# Patient Record
Sex: Female | Born: 1990 | Race: White | Hispanic: No | Marital: Single | Smoking: Never smoker
Health system: Southern US, Community
[De-identification: ages and names within clinical notes are randomized; demographics above are authoritative.]

## PROBLEM LIST (undated history)

## (undated) DIAGNOSIS — R51 Headache: Secondary | ICD-10-CM

## (undated) DIAGNOSIS — T4145XA Adverse effect of unspecified anesthetic, initial encounter: Secondary | ICD-10-CM

## (undated) DIAGNOSIS — T753XXA Motion sickness, initial encounter: Secondary | ICD-10-CM

## (undated) DIAGNOSIS — T8859XA Other complications of anesthesia, initial encounter: Secondary | ICD-10-CM

## (undated) DIAGNOSIS — R519 Headache, unspecified: Secondary | ICD-10-CM

## (undated) HISTORY — PX: TOOTH EXTRACTION: SUR596

## (undated) HISTORY — PX: TONSILLECTOMY: SUR1361

## (undated) HISTORY — PX: WISDOM TOOTH EXTRACTION: SHX21

---
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
## 2012-10-13 ENCOUNTER — Other Ambulatory Visit: Payer: Self-pay | Admitting: Physical Medicine and Rehabilitation

## 2012-10-13 DIAGNOSIS — M545 Low back pain, unspecified: Secondary | ICD-10-CM

## 2012-10-20 ENCOUNTER — Other Ambulatory Visit: Payer: Self-pay | Admitting: Physical Medicine and Rehabilitation

## 2012-10-20 ENCOUNTER — Ambulatory Visit
Admission: RE | Admit: 2012-10-20 | Discharge: 2012-10-20 | Disposition: A | Discharge status: Home / Self Care | Payer: BC Managed Care – HMO | Source: Ambulatory Visit | Attending: Physical Medicine and Rehabilitation | Admitting: Physical Medicine and Rehabilitation

## 2012-10-20 DIAGNOSIS — M545 Low back pain, unspecified: Secondary | ICD-10-CM

## 2012-10-20 MED ORDER — IOHEXOL 180 MG/ML  SOLN
1.0000 mL | Freq: Once | INTRAMUSCULAR | Status: AC | PRN
  Administered 2012-10-20: 1 mL via EPIDURAL

## 2012-10-20 MED ORDER — METHYLPREDNISOLONE ACETATE 40 MG/ML INJ SUSP (RADIOLOG
120.0000 mg | Freq: Once | INTRAMUSCULAR | Status: AC
  Administered 2012-10-20: 120 mg via EPIDURAL

## 2015-02-02 NOTE — Discharge Instructions (Signed)
General Anesthesia, Adult °General anesthesia is a sleep-like state of non-feeling produced by medicines (anesthetics). General anesthesia prevents you from being alert and feeling pain during a medical procedure. Your caregiver may recommend general anesthesia if your procedure: °· Is long. °· Is painful or uncomfortable. °· Would be frightening to see or hear. °· Requires you to be still. °· Affects your breathing. °· Causes significant blood loss. °LET YOUR CAREGIVER KNOW ABOUT: °· Allergies to food or medicine. °· Medicines taken, including vitamins, herbs, eyedrops, over-the-counter medicines, and creams. °· Use of steroids (by mouth or creams). °· Previous problems with anesthetics or numbing medicines, including problems experienced by relatives. °· History of bleeding problems or blood clots. °· Previous surgeries and types of anesthetics received. °· Possibility of pregnancy, if this applies. °· Use of cigarettes, alcohol, or illegal drugs. °· Any health condition(s), especially diabetes, sleep apnea, and high blood pressure. °RISKS AND COMPLICATIONS °General anesthesia rarely causes complications. However, if complications do occur, they can be life threatening. Complications include: °· A lung infection. °· A stroke. °· A heart attack. °· Waking up during the procedure. When this occurs, the patient may be unable to move and communicate that he or she is awake. The patient may feel severe pain. °Older adults and adults with serious medical problems are more likely to have complications than adults who are young and healthy. Some complications can be prevented by answering all of your caregiver's questions thoroughly and by following all pre-procedure instructions. It is important to tell your caregiver if any of the pre-procedure instructions, especially those related to diet, were not followed. Any food or liquid in the stomach can cause problems when you are under general anesthesia. °BEFORE THE  PROCEDURE °· Ask your caregiver if you will have to spend the night at the hospital. If you will not have to spend the night, arrange to have an adult drive you and stay with you for 24 hours. °· Follow your caregiver's instructions if you are taking dietary supplements or medicines. Your caregiver may tell you to stop taking them or to reduce your dosage. °· Do not smoke for as long as possible before your procedure. If possible, stop smoking 3-6 weeks before the procedure. °· Do not take new dietary supplements or medicines within 1 week of your procedure unless your caregiver approves them. °· Do not eat within 8 hours of your procedure or as directed by your caregiver. Drink only clear liquids, such as water, black coffee (without milk or cream), and fruit juices (without pulp). °· Do not drink within 3 hours of your procedure or as directed by your caregiver. °· You may brush your teeth on the morning of the procedure, but make sure to spit out the toothpaste and water when finished. °PROCEDURE  °You will receive anesthetics through a mask, through an intravenous (IV) access tube, or through both. A doctor who specializes in anesthesia (anesthesiologist) or a nurse who specializes in anesthesia (nurse anesthetist) or both will stay with you throughout the procedure to make sure you remain unconscious. He or she will also watch your blood pressure, pulse, and oxygen levels to make sure that the anesthetics do not cause any problems. Once you are asleep, a breathing tube or mask may be used to help you breathe. °AFTER THE PROCEDURE °You will wake up after the procedure is complete. You may be in the room where the procedure was performed or in a recovery area. You may have a sore throat   if a breathing tube was used. You may also feel:  Dizzy.  Weak.  Drowsy.  Confused.  Nauseous.  Cold. These are all normal responses and can be expected to last for up to 24 hours after the procedure is complete. A  caregiver will tell you when you are ready to go home. This will usually be when you are fully awake and in stable condition.   This information is not intended to replace advice given to you by your health care provider. Make sure you discuss any questions you have with your health care provider.   Document Released: 06/12/2007 Document Revised: 03/26/2014 Document Reviewed: 07/04/2011 Elsevier Interactive Patient Education 2016 [employer]    [facility] ENDOSCOPIC [facility]US [facility]  What is Functional Endoscopic Sinus Surgery?  The Surgery involves making the natural openings of the sinuses larger by removing the bony partitions that separate the sinuses from the nasal cavity.  The natural sinus lining is preserved as much as possible to allow the sinuses to resume normal function after the surgery.  In some patients nasal polyps (excessively swollen lining of the sinuses) may be removed to relieve obstruction of the sinus openings.  The surgery is performed through the nose using lighted scopes, which eliminates the need for incisions on the face.  A septoplasty is a different procedure which is sometimes performed with sinus surgery.  It involves straightening the boy partition that separates the two sides of your nose.  A crooked or deviated septum may need repair if is obstructing the sinuses or nasal airflow.  Turbinate reduction is also often performed during sinus surgery.  The turbinates are bony proturberances from the side walls of the nose which swell and can obstruct the nose in patients with sinus and allergy problems.  Their size can be surgically reduced to help relieve nasal obstruction.  What Can Sinus Surgery Do For Me?  Sinus surgery can reduce the frequency of sinus infections requiring antibiotic treatment.  This can provide improvement in nasal congestion, post-nasal drainage, facial pressure and  nasal obstruction.  Surgery will NOT prevent you from ever having an infection again, so it usually only for patients who get infections 4 or more times yearly requiring antibiotics, or for infections that do not clear with antibiotics.  It will not cure nasal allergies, so patients with allergies may still require medication to treat their allergies after surgery. Surgery may improve headaches related to sinusitis, however, some people will continue to require medication to control sinus headaches related to allergies.  Surgery will do nothing for other forms of headache (migraine, tension or cluster).  What Are the Risks of Endoscopic Sinus Surgery?  Current techniques allow surgery to be performed safely with little risk, however, there are rare complications that patients should be aware of.  Because the sinuses are located around the eyes, there is risk of eye injury, including blindness, though again, this would be quite rare. This is usually a result of bleeding behind the eye during surgery, which puts the vision oat risk, though there are treatments to protect the vision and prevent permanent disrupted by surgery causing a leak of the spinal fluid that surrounds the brain.  More serious complications would include bleeding inside the brain cavity or damage to the brain.  Again, all of these complications are uncommon, and spinal fluid leaks can be safely managed surgically if they occur.  The most common complication of  sinus surgery is bleeding from the nose, which may require packing or cauterization of the nose.  Continued sinus have polyps may experience recurrence of the polyps requiring revision surgery.  Alterations of sense of smell or injury to the tear ducts are also rare complications.   What is the Surgery Like, and what is the Recovery?  The Surgery usually takes a couple of hours to perform, and is usually performed under a general anesthetic (completely asleep).  Patients are usually  discharged home after a couple of hours.  Sometimes during surgery it is necessary to pack the nose to control bleeding, and the packing is left in place for 24 - 48 hours, and removed by your surgeon.  If a septoplasty was performed during the procedure, there is often a splint placed which must be removed after 5-7 days.   Discomfort: Pain is usually mild to moderate, and can be controlled by prescription pain medication or acetaminophen (Tylenol).  Aspirin, Ibuprofen (Advil, Motrin), or Naprosyn (Aleve) should be avoided, as they can cause increased bleeding.  Most patients feel sinus pressure like they have a bad head cold for several days.  Sleeping with your head elevated can help reduce swelling and facial pressure, as can ice packs over the face.  A humidifier may be helpful to keep the mucous and blood from drying in the nose.   Diet: There are no specific diet restrictions, however, you should generally start with clear liquids and a light diet of bland foods because the anesthetic can cause some nausea.  Advance your diet depending on how your stomach feels.  Taking your pain medication with food will often help reduce stomach upset which pain medications can cause.  Nasal Saline Irrigation: It is important to remove blood clots and dried mucous from the nose as it is healing.  This is done by having you irrigate the nose at least 3 - 4 times daily with a salt water solution.  We recommend using NeilMed Sinus Rinse (available at the drug store).  Fill the squeeze bottle with the solution, bend over a sink, and insert the tip of the squeeze bottle into the nose  of an inch.  Point the tip of the squeeze bottle towards the inside corner of the eye on the same side your irrigating.  Squeeze the bottle and gently irrigate the nose.  If you bend forward as you do this, most of the fluid will flow back out of the nose, instead of down your throat.   The solution should be warm, near body temperature, when  you irrigate.   Each time you irrigate, you should use a full squeeze bottle.   Note that if you are instructed to use Nasal Steroid Sprays at any time after your surgery, irrigate with saline BEFORE using the steroid spray, so you do not wash it all out of the nose. Another product, Nasal Saline Gel (such as AYR Nasal Saline Gel) can be applied in each nostril 3 - 4 times daily to moisture the nose and reduce scabbing or crusting.  Bleeding:  Bloody drainage from the nose can be expected for several days, and patients are instructed to irrigate their nose frequently with salt water to help remove mucous and blood clots.  The drainage may be dark red or brown, though some fresh blood may be seen intermittently, especially after irrigation.  Do not blow you nose, as bleeding may occur. If you must sneeze, keep your mouth open to allow air  to escape through your mouth.  If heavy bleeding occurs: Irrigate the nose with saline to rinse out clots, then spray the nose 3 - 4 times with Afrin Nasal Decongestant Spray.  The spray will constrict the blood vessels to slow bleeding.  Pinch the lower half of your nose shut to apply pressure, and lay down with your head elevated.  Ice packs over the nose may help as well. If bleeding persists despite these measures, you should notify your doctor.  Do not use the Afrin routinely to control nasal congestion after surgery, as it can result in worsening congestion and may affect healing.     Activity: Return to work varies among patients. Most patients will be out of work at least 5 - 7 days to recover.  Patient may return to work after they are off of narcotic pain medication, and feeling well enough to perform the functions of their job.  Patients must avoid heavy lifting (over 10 pounds) or strenuous physical for 2 weeks after surgery, so your employer may need to assign you to light duty, or keep you out of work longer if light duty is not possible.  NOTE: you should  not drive, operate dangerous machinery, do any mentally demanding tasks or make any important legal or financial decisions while on narcotic pain medication and recovering from the general anesthetic.    Call Your Doctor Immediately if You Have Any of the Following: 1. Bleeding that you cannot control with the above measures 2. Loss of vision, double vision, bulging of the eye or black eyes. 3. Fever over 101 degrees 4. Neck stiffness with severe headache, fever, nausea and change in mental state. You are always encourage to call anytime with concerns, however, please call with requests for pain medication refills during office hours.  Office Endoscopy: During follow-up visits your doctor will remove any packing or splints that may have been placed and evaluate and clean your sinuses endoscopically.  Topical anesthetic will be used to make this as comfortable as possible, though you may want to take your pain medication prior to the visit.  How often this will need to be done varies from patient to patient.  After complete recovery from the surgery, you may need follow-up endoscopy from time to time, particularly if there is concern of recurrent infection or nasal polyps.

## 2015-02-03 ENCOUNTER — Ambulatory Visit: Payer: BLUE CROSS/BLUE SHIELD | Admitting: Anesthesiology

## 2015-02-03 ENCOUNTER — Encounter
Admission: RE | Disposition: A | Discharge status: Home / Self Care | Payer: Self-pay | Source: Ambulatory Visit | Attending: Otolaryngology

## 2015-02-03 ENCOUNTER — Ambulatory Visit
Admission: RE | Admit: 2015-02-03 | Discharge: 2015-02-03 | Disposition: A | Discharge status: Home / Self Care | Payer: BLUE CROSS/BLUE SHIELD | Source: Ambulatory Visit | Attending: Otolaryngology | Admitting: Otolaryngology

## 2015-02-03 DIAGNOSIS — Z809 Family history of malignant neoplasm, unspecified: Secondary | ICD-10-CM | POA: Insufficient documentation

## 2015-02-03 DIAGNOSIS — Z79899 Other long term (current) drug therapy: Secondary | ICD-10-CM | POA: Insufficient documentation

## 2015-02-03 DIAGNOSIS — J320 Chronic maxillary sinusitis: Secondary | ICD-10-CM | POA: Diagnosis not present

## 2015-02-03 DIAGNOSIS — J342 Deviated nasal septum: Secondary | ICD-10-CM | POA: Insufficient documentation

## 2015-02-03 DIAGNOSIS — J343 Hypertrophy of nasal turbinates: Secondary | ICD-10-CM | POA: Insufficient documentation

## 2015-02-03 DIAGNOSIS — Z833 Family history of diabetes mellitus: Secondary | ICD-10-CM | POA: Diagnosis not present

## 2015-02-03 HISTORY — DX: Adverse effect of unspecified anesthetic, initial encounter: T41.45XA

## 2015-02-03 HISTORY — PX: IMAGE GUIDED SINUS SURGERY: SHX6570

## 2015-02-03 HISTORY — PX: NASAL SEPTOPLASTY W/ TURBINOPLASTY: SHX2070

## 2015-02-03 HISTORY — PX: MAXILLARY ANTROSTOMY: SHX2003

## 2015-02-03 HISTORY — DX: Motion sickness, initial encounter: T75.3XXA

## 2015-02-03 HISTORY — DX: Other complications of anesthesia, initial encounter: T88.59XA

## 2015-02-03 HISTORY — DX: Headache: R51

## 2015-02-03 HISTORY — DX: Headache, unspecified: R51.9

## 2015-02-03 SURGERY — SINUS SURGERY, WITH IMAGING GUIDANCE
Anesthesia: General | Laterality: Left | Wound class: Clean Contaminated

## 2015-02-03 MED ORDER — ROCURONIUM BROMIDE 100 MG/10ML IV SOLN
INTRAVENOUS | Status: DC | PRN
  Administered 2015-02-03: 10 mg via INTRAVENOUS
  Administered 2015-02-03: 30 mg via INTRAVENOUS

## 2015-02-03 MED ORDER — SCOPOLAMINE 1 MG/3DAYS TD PT72
MEDICATED_PATCH | TRANSDERMAL | Status: DC | PRN
  Administered 2015-02-03: 1 via TRANSDERMAL

## 2015-02-03 MED ORDER — ACETAMINOPHEN 10 MG/ML IV SOLN
1000.0000 mg | Freq: Once | INTRAVENOUS | Status: AC
  Administered 2015-02-03: 1000 mg via INTRAVENOUS

## 2015-02-03 MED ORDER — LIDOCAINE-EPINEPHRINE 1 %-1:100000 IJ SOLN
INTRAMUSCULAR | Status: DC | PRN
  Administered 2015-02-03: 4.5 mL

## 2015-02-03 MED ORDER — ONDANSETRON HCL 4 MG/2ML IJ SOLN
INTRAMUSCULAR | Status: DC | PRN
  Administered 2015-02-03: 4 mg via INTRAVENOUS

## 2015-02-03 MED ORDER — CEFAZOLIN SODIUM-DEXTROSE 2-3 GM-% IV SOLR
2.0000 g | Freq: Once | INTRAVENOUS | Status: AC
  Administered 2015-02-03: 2 g via INTRAVENOUS

## 2015-02-03 MED ORDER — LACTATED RINGERS IV SOLN
INTRAVENOUS | Status: DC
  Administered 2015-02-03: 13:00:00 via INTRAVENOUS

## 2015-02-03 MED ORDER — FENTANYL CITRATE (PF) 100 MCG/2ML IJ SOLN
INTRAMUSCULAR | Status: DC | PRN
  Administered 2015-02-03 (×2): 50 ug via INTRAVENOUS

## 2015-02-03 MED ORDER — MIDAZOLAM HCL 5 MG/5ML IJ SOLN
INTRAMUSCULAR | Status: DC | PRN
  Administered 2015-02-03: 2 mg via INTRAVENOUS

## 2015-02-03 MED ORDER — OXYMETAZOLINE HCL 0.05 % NA SOLN
2.0000 | Freq: Once | NASAL | Status: AC
  Administered 2015-02-03: 2 via NASAL

## 2015-02-03 MED ORDER — PROMETHAZINE HCL 25 MG/ML IJ SOLN
6.2500 mg | Freq: Once | INTRAMUSCULAR | Status: AC
  Administered 2015-02-03: 6.25 mg via INTRAVENOUS

## 2015-02-03 MED ORDER — ONDANSETRON HCL 4 MG/2ML IJ SOLN: 4.0000 mg | Freq: Once | INTRAMUSCULAR | Status: DC | PRN

## 2015-02-03 MED ORDER — LIDOCAINE HCL (CARDIAC) 20 MG/ML IV SOLN
INTRAVENOUS | Status: DC | PRN
  Administered 2015-02-03: 40 mg via INTRAVENOUS

## 2015-02-03 MED ORDER — FENTANYL CITRATE (PF) 100 MCG/2ML IJ SOLN: 25.0000 ug | INTRAMUSCULAR | Status: DC | PRN

## 2015-02-03 MED ORDER — DEXAMETHASONE SODIUM PHOSPHATE 4 MG/ML IJ SOLN
INTRAMUSCULAR | Status: DC | PRN
Start: 2015-02-03 — End: 2015-02-03
  Administered 2015-02-03: 10 mg via INTRAVENOUS

## 2015-02-03 MED ORDER — SUCCINYLCHOLINE CHLORIDE 20 MG/ML IJ SOLN
INTRAMUSCULAR | Status: DC | PRN
  Administered 2015-02-03: 100 mg via INTRAVENOUS

## 2015-02-03 MED ORDER — PHENYLEPHRINE HCL 0.5 % NA SOLN
NASAL | Status: DC | PRN
  Administered 2015-02-03: 30 mL via TOPICAL

## 2015-02-03 MED ORDER — PROPOFOL 10 MG/ML IV BOLUS
INTRAVENOUS | Status: DC | PRN
  Administered 2015-02-03: 200 mg via INTRAVENOUS

## 2015-02-03 MED ORDER — OXYCODONE HCL 5 MG PO TABS: 10.0000 mg | ORAL_TABLET | Freq: Once | ORAL | Status: DC

## 2015-02-03 MED ORDER — OXYMETAZOLINE HCL 0.05 % NA SOLN: 1.0000 | Freq: Two times a day (BID) | NASAL | Status: DC

## 2015-02-03 SURGICAL SUPPLY — 44 items
BALLOON SINUPLASTY SYSTEM (BALLOONS) IMPLANT
BATTERY INSTRU NAVIGATION (MISCELLANEOUS) ×12 IMPLANT
BLADE SURG 15 STRL LF DISP TIS (BLADE) IMPLANT
BLADE SURG 15 STRL SS (BLADE)
CANISTER SUCT 1200ML W/VALVE (MISCELLANEOUS) ×3 IMPLANT
CATH IV 18X1 1/4 SAFELET (CATHETERS) ×3 IMPLANT
COAG SUCT 10F 3.5MM HAND CTRL (MISCELLANEOUS) ×3 IMPLANT
COAGULATOR SUCT 8FR VV (MISCELLANEOUS) IMPLANT
DEVICE INFLATION SEID (MISCELLANEOUS) IMPLANT
DRAPE HEAD BAR (DRAPES) ×3 IMPLANT
DRESSING NASL FOAM PST OP SINU (MISCELLANEOUS) IMPLANT
DRSG NASAL 4CM NASOPORE (MISCELLANEOUS) IMPLANT
DRSG NASAL FOAM POST OP SINU (MISCELLANEOUS)
GLOVE PI ULTRA LF STRL 7.5 (GLOVE) ×6 IMPLANT
GLOVE PI ULTRA NON LATEX 7.5 (GLOVE) ×3
IRRIGATOR 4MM STR (IRRIGATION / IRRIGATOR) ×3 IMPLANT
IV CATH 18X1 1/4 SAFELET (CATHETERS) ×2
IV NS 500ML (IV SOLUTION) ×1
IV NS 500ML BAXH (IV SOLUTION) ×2 IMPLANT
KIT ROOM TURNOVER OR (KITS) ×3 IMPLANT
NAVIGATION MASK REG  ST (MISCELLANEOUS) ×3 IMPLANT
NEEDLE HYPO 25GX1X1/2 BEV (NEEDLE) ×3 IMPLANT
NEEDLE SPNL 25GX3.5 QUINCKE BL (NEEDLE) ×3 IMPLANT
NS IRRIG 500ML POUR BTL (IV SOLUTION) ×3 IMPLANT
PACK DRAPE NASAL/ENT (PACKS) ×3 IMPLANT
PACKING NASAL EPIS 4X2.4 XEROG (MISCELLANEOUS) ×6 IMPLANT
PAD GROUND ADULT SPLIT (MISCELLANEOUS) ×3 IMPLANT
PATTIES SURGICAL .5 X3 (DISPOSABLE) ×3 IMPLANT
SET HANDPIECE IRR DIEGO (MISCELLANEOUS) ×3 IMPLANT
SOL ANTI-FOG 6CC FOG-OUT (MISCELLANEOUS) ×2 IMPLANT
SOL FOG-OUT ANTI-FOG 6CC (MISCELLANEOUS) ×1
SPLINT NASAL SEPTAL BLV .50 ST (MISCELLANEOUS) ×3 IMPLANT
STRAP BODY AND KNEE 60X3 (MISCELLANEOUS) ×6 IMPLANT
SUT CHROMIC 3-0 (SUTURE)
SUT CHROMIC 3-0 KS 27XMFL CR (SUTURE)
SUT ETHILON 3-0 KS 30 BLK (SUTURE) ×3 IMPLANT
SUT ETHILON 4-0 (SUTURE)
SUT ETHILON 4-0 FS2 18XMFL BLK (SUTURE)
SUT PLAIN GUT 4-0 (SUTURE) ×3 IMPLANT
SUTURE CHRMC 3-0 KS 27XMFL CR (SUTURE) IMPLANT
SUTURE ETHLN 4-0 FS2 18XMF BLK (SUTURE) IMPLANT
SYR 3ML LL SCALE MARK (SYRINGE) ×3 IMPLANT
TOWEL OR 17X26 4PK STRL BLUE (TOWEL DISPOSABLE) ×3 IMPLANT
WATER STERILE IRR 500ML POUR (IV SOLUTION) ×3 IMPLANT

## 2015-02-03 NOTE — Op Note (Signed)
02/03/2015  3:41 PM    Sara Moses, Sara  [id number]   Pre-Op Dx:  Deviated septum, turbinate hypertrophy, bilateral chronic maxillary sinusitis  Post-op Dx: Same  Proc: Septoplasty, bilateral endoscopic maxillary antrostomies, endoscopic trimming of the left middle turbinate, partial reduction of the left inferior turbinate, use of image guided system   Surg:  Karielle Davidow H  Anes:  GOT  EBL:  100 mL  Comp:  None  Findings:  Very large spur of the septum posteriorly at the vomer pushing into the right side and blocking the posterior airway. The upper cartilage and ethmoid plate were buckled towards the right side as well. Left middle turbinate was enlarged and the left inferior turbinate was enlarged as the septum was bowed to the right side  Procedure: The patient was given general anesthesia by oral endotracheal intubation. She was placed in a supine position on the table. Her nose was prepped with 5-1/2 mL of 1% Xylocaine with epi 100,000 for infiltration nasal septum and lateral nasal walls. She then had cottonoid pledgets soaked in phenylephrine and Afrin that were placed into the nasal passages on both sides. The image guided system was brought in and the CT scan was downloaded from the disc. The template was applied the face and this was registered to the system as well.  there was 0.7 mm of variance. The suction isthmus were registered the system and the showed good alignment with the system. She was prepped and draped sterile fashion.  The 0 scope was used to visualize both sides the nose. The septum and held towards the right side with the extremely large spur posteriorly on the right blocking the posterior airway. A left Killian incision was created with elevation of the mucoperichondrium on the left side. The bony cartilaginous junction was split and the mucoperiosteum was elevated on both sides of the ethmoid plate and vomer. The posterior quadrangular plate and part of the  vomer were removed. This remove the very crooked spur. There was overhanging cartilage as well. Some of the inferior border the quadrangular plate was trimmed slightly to allow back towards the middle. Part of the ethmoid plate was trimmed as well since it was buckled towards the right side. This allowed the mucosal flaps to fall back to the midline. There is small tear mucosa posteriorly where the large spur was removed. He Coso flaps are sutured in position using a 40 plain gut suture in a through and through whip stitch fashion. This was used to close the BertramKillian incision as well.  The 0 scope was used in the right side visualize the area. The middle turbinate was infractured to allow us to visualize the middle meatus better area her right maxillary sinus was way underdeveloped. The uncinate process was incised and removed using the side biter as well as the [facility] micro[facility]. Once the uncinate process was removed see a small opening into the maxillary antrum and this was followed. The opening was widened as much as possible but she had a very small sinus so the natural ostium that was widened was still relatively small. It was only about 1 cm in size at the opening. Ears scar tissue inside the sinus and thickened membranes but no sign of nucleus of purulence. The 30 and 70 scopes were used to visualize this area and us was as widely open as I could get it.  The 0 scope was used to visualize the left nostril inferior turbinate was enlarged and an filling much of the  airway. It was trimmed along its inferior border. What cautery was used to help control this and then the remainder portion of the turbinate was outfractured. Left more room to visualize the middle meatus. The middle turbinate was infractured. It had a very thick bone but lots of polypoid tissues along its more lateral border. Some of this was trimmed with the microdebrider and then electrocautery was used to control bleeding. This opened up  the middle meatus better. The uncinate process was incised with the side biter. It was removed using the [facility] microdebrider area and there is a extremely small opening into the antrum and this was widened provide good antral opening. The ethmoid bulla was fractured accidentally during this and so it was opened up to expose some of the middle and posterior air cells. The 30 scope was used for making sure the natural ostium was opened in the left maxillary sinus.  Both sides were revisualized make sure the sinus were opened and clear. The middle turbinates were line medially. The meatus was filled with some xerogel on both sides and wetted to liquefy. Xomed 0.5 mm regular sized splints were trimmed and placed on both sides the septum. There were held in position with a 3-0 nylon through and through suture. The patient tolerated the procedure well. She was awakened and taken to the recovery room in satisfactory condition.  Dispo:   To PACU to be discharged home  Plan:  To follow-up in the office in 6 days to remove splints. 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 Heart saline flushes tomorrow.  Camron Monday H  02/03/2015 3:41 PM

## 2015-02-03 NOTE — Anesthesia Preprocedure Evaluation (Signed)
Anesthesia Evaluation  Patient identified by MRN, date of birth, ID band  Reviewed: Allergy & Precautions, H&P , NPO status , Patient's Chart, lab work & pertinent test results  History of Anesthesia Complications Negative for: history of anesthetic complications  Airway Mallampati: II  TM Distance: >3 FB Neck ROM: full    Dental no notable dental hx.    Pulmonary    Pulmonary exam normal        Cardiovascular  Rhythm:regular Rate:Normal     Neuro/Psych    GI/Hepatic   Endo/Other    Renal/GU      Musculoskeletal   Abdominal   Peds  Hematology   Anesthesia Other Findings   Reproductive/Obstetrics                             Anesthesia Physical Anesthesia Plan  ASA: I  Anesthesia Plan: General ETT   Post-op Pain Management:    Induction:   Airway Management Planned:   Additional Equipment:   Intra-op Plan:   Post-operative Plan:   Informed Consent: I have reviewed the patients History and Physical, chart, labs and discussed the procedure including the risks, benefits and alternatives for the proposed anesthesia with the patient or authorized representative who has indicated his/her understanding and acceptance.     Plan Discussed with: CRNA  Anesthesia Plan Comments:         Anesthesia Quick Evaluation

## 2015-02-03 NOTE — Transfer of Care (Signed)
Immediate Anesthesia Transfer of Care Note  Patient: Sara SkainsDenae Moses  Procedure(s) Performed: Procedure(s) with comments: IMAGE GUIDED SINUS SURGERY (Bilateral) - UPREG GAVE DISK TO BRENDA 11/15 NASAL SEPTOPLASTY WITH TURBINATE REDUCTION (Left) - turbinate reduction is left and left middle MAXILLARY ANTROSTOMY (Bilateral)  Patient Location: PACU  Anesthesia Type: General ETT  Level of Consciousness: awake, alert  and patient cooperative  Airway and Oxygen Therapy: Patient Spontanous Breathing and Patient connected to supplemental oxygen  Post-op Assessment: Post-op Vital signs reviewed, Patient's Cardiovascular Status Stable, Respiratory Function Stable, Patent Airway and No signs of Nausea or vomiting  Post-op Vital Signs: Reviewed and stable  Complications: No apparent anesthesia complications

## 2015-02-03 NOTE — H&P (Signed)
  H&P has been reviewed and no changes necessary. To be downloaded later. 

## 2015-02-03 NOTE — Anesthesia Postprocedure Evaluation (Signed)
  Anesthesia Post-op Note  Patient: Sara Moses  Procedure(s) Performed: Procedure(s) with comments: IMAGE GUIDED SINUS SURGERY (Bilateral) - UPREG GAVE DISK TO BRENDA 11/15 NASAL SEPTOPLASTY WITH TURBINATE REDUCTION (Left) - turbinate reduction is left and left middle MAXILLARY ANTROSTOMY (Bilateral)  Anesthesia type:General ETT  Patient location: PACU  Post pain: Pain level controlled  Post assessment: Post-op Vital signs reviewed, Patient's Cardiovascular Status Stable, Respiratory Function Stable, Patent Airway and No signs of Nausea or vomiting  Post vital signs: Reviewed and stable  Last Vitals:  Filed Vitals:   02/03/15 1630  BP: 112/71  Pulse: 73  Temp:   Resp: 18    Level of consciousness: awake, alert  and patient cooperative  Complications: No apparent anesthesia complications

## 2015-02-03 NOTE — Anesthesia Procedure Notes (Signed)
Procedure Name: Intubation Date/Time: 02/03/2015 2:17 PM Performed by: Andee PolesBUSH, Miciah Covelli Pre-anesthesia Checklist: Patient identified, Emergency Drugs available, Suction available, Patient being monitored and Timeout performed Patient Re-evaluated:Patient Re-evaluated prior to inductionOxygen Delivery Method: Circle system utilized Preoxygenation: Pre-oxygenation with 100% oxygen Intubation Type: IV induction Ventilation: Mask ventilation without difficulty Laryngoscope Size: Mac and 3 Grade View: Grade I Tube type: Oral Rae Tube size: 7.0 mm Number of attempts: 1 Placement Confirmation: ETT inserted through vocal cords under direct vision,  positive ETCO2 and breath sounds checked- equal and bilateral Tube secured with: Tape Dental Injury: Teeth and Oropharynx as per pre-operative assessment

## 2015-02-04 ENCOUNTER — Encounter: Payer: Self-pay | Admitting: Otolaryngology

## 2015-02-07 LAB — SURGICAL PATHOLOGY
